# Patient Record
Sex: Female | Born: 1985 | Hispanic: No | Marital: Married | Smoking: Never smoker
Health system: Southern US, Community
[De-identification: ages and names within clinical notes are randomized; demographics above are authoritative.]

## PROBLEM LIST (undated history)

## (undated) DIAGNOSIS — I4510 Unspecified right bundle-branch block: Secondary | ICD-10-CM

## (undated) HISTORY — PX: RHINOPLASTY: SUR1284

---

## 2016-01-20 ENCOUNTER — Encounter (HOSPITAL_BASED_OUTPATIENT_CLINIC_OR_DEPARTMENT_OTHER): Payer: Self-pay

## 2016-01-20 ENCOUNTER — Ambulatory Visit (HOSPITAL_BASED_OUTPATIENT_CLINIC_OR_DEPARTMENT_OTHER)
Admission: RE | Admit: 2016-01-20 | Discharge: 2016-01-20 | Disposition: A | Discharge status: Home / Self Care | Payer: BLUE CROSS/BLUE SHIELD | Source: Ambulatory Visit | Attending: Emergency Medicine | Admitting: Emergency Medicine

## 2016-01-20 ENCOUNTER — Telehealth (HOSPITAL_BASED_OUTPATIENT_CLINIC_OR_DEPARTMENT_OTHER): Payer: Self-pay

## 2016-01-20 ENCOUNTER — Emergency Department (HOSPITAL_BASED_OUTPATIENT_CLINIC_OR_DEPARTMENT_OTHER)
Admission: EM | Admit: 2016-01-20 | Discharge: 2016-01-20 | Disposition: A | Discharge status: Home / Self Care | Payer: BLUE CROSS/BLUE SHIELD | Attending: Emergency Medicine | Admitting: Emergency Medicine

## 2016-01-20 ENCOUNTER — Encounter (HOSPITAL_BASED_OUTPATIENT_CLINIC_OR_DEPARTMENT_OTHER): Payer: Self-pay | Admitting: *Deleted

## 2016-01-20 DIAGNOSIS — O9989 Other specified diseases and conditions complicating pregnancy, childbirth and the puerperium: Secondary | ICD-10-CM | POA: Diagnosis not present

## 2016-01-20 DIAGNOSIS — R102 Pelvic and perineal pain: Secondary | ICD-10-CM

## 2016-01-20 DIAGNOSIS — N939 Abnormal uterine and vaginal bleeding, unspecified: Secondary | ICD-10-CM | POA: Insufficient documentation

## 2016-01-20 DIAGNOSIS — O209 Hemorrhage in early pregnancy, unspecified: Secondary | ICD-10-CM | POA: Diagnosis present

## 2016-01-20 DIAGNOSIS — Z8679 Personal history of other diseases of the circulatory system: Secondary | ICD-10-CM | POA: Insufficient documentation

## 2016-01-20 DIAGNOSIS — Z3A01 Less than 8 weeks gestation of pregnancy: Secondary | ICD-10-CM | POA: Diagnosis not present

## 2016-01-20 DIAGNOSIS — M545 Low back pain: Secondary | ICD-10-CM | POA: Diagnosis not present

## 2016-01-20 DIAGNOSIS — Z79899 Other long term (current) drug therapy: Secondary | ICD-10-CM | POA: Insufficient documentation

## 2016-01-20 DIAGNOSIS — O4690 Antepartum hemorrhage, unspecified, unspecified trimester: Secondary | ICD-10-CM

## 2016-01-20 HISTORY — DX: Unspecified right bundle-branch block: I45.10

## 2016-01-20 LAB — URINALYSIS, ROUTINE W REFLEX MICROSCOPIC
BILIRUBIN URINE: NEGATIVE
Glucose, UA: NEGATIVE mg/dL
Ketones, ur: NEGATIVE mg/dL
LEUKOCYTES UA: NEGATIVE
NITRITE: NEGATIVE
PH: 6 (ref 5.0–8.0)
Protein, ur: NEGATIVE mg/dL
SPECIFIC GRAVITY, URINE: 1.013 (ref 1.005–1.030)

## 2016-01-20 LAB — WET PREP, GENITAL
SPERM: NONE SEEN
Trich, Wet Prep: NONE SEEN
YEAST WET PREP: NONE SEEN

## 2016-01-20 LAB — URINE MICROSCOPIC-ADD ON
RBC / HPF: NONE SEEN RBC/hpf (ref 0–5)
WBC, UA: NONE SEEN WBC/hpf (ref 0–5)

## 2016-01-20 LAB — HCG, QUANTITATIVE, PREGNANCY: HCG, BETA CHAIN, QUANT, S: 127 m[IU]/mL — AB (ref <5)

## 2016-01-20 LAB — PREGNANCY, URINE: Preg Test, Ur: POSITIVE — AB

## 2016-01-20 MED ORDER — RHO D IMMUNE GLOBULIN 1500 UNIT/2ML IJ SOSY
300.0000 ug | PREFILLED_SYRINGE | Freq: Once | INTRAMUSCULAR | Status: AC
  Administered 2016-01-20: 300 ug via INTRAMUSCULAR

## 2016-01-20 NOTE — Discharge Instructions (Signed)
Go to Dr. Adaline Sill office in 2 days to get your hCG level rechecked. Today's was low at 127, consistent with miscarriage. You have been given RhoGAM here. Your blood type is A-. Take these instructions with you when you go to see Dr. Loleta Chance If you develop worsening abdominal pain, significant bleeding or feel faint go to the nearest emergency department immediately

## 2016-01-20 NOTE — ED Provider Notes (Addendum)
CSN: [id number]     Arrival date & time 01/20/16  0751 History   First MD Initiated Contact with Patient 01/20/16 0802     Chief Complaint  Patient presents with  . Abdominal Pain     (Consider location/radiation/quality/duration/timing/severity/associated sxs/prior Treatment) Patient is a 30 y.o. female presenting with abdominal pain.  Abdominal Pain Associated symptoms: vaginal bleeding    Patient developed vaginal bleeding this morning when she urinated. She is currently pregnant. Last normal menstrual period 12/10/2015. She's had crampy lower abdominal pain, mild for the past 4 weeks she developed bilateral low back pain this morning. Pain is mild she denies any lightheadedness. No other associated symptoms. No treatment prior to coming here. Past Medical History  Diagnosis Date  . Right bundle branch block    Past Surgical History  Procedure Laterality Date  . Rhinoplasty     No family history on file. Social History  Substance Use Topics  . Smoking status: Never Smoker   . Smokeless tobacco: None  . Alcohol Use: Yes   no illicit drug use no alcohol OB History    No data available     Review of Systems  Gastrointestinal: Positive for abdominal pain.  Genitourinary: Positive for vaginal bleeding.  Musculoskeletal: Positive for back pain.  All other systems reviewed and are negative.     Allergies  Review of patient's allergies indicates no known allergies.  Home Medications   Prior to Admission medications   Medication Sig Start Date End Date Taking? Authorizing Provider  Loratadine (CLARITIN PO) Take by mouth.   Yes Historical Provider, MD  Prenatal Vit-Fe Fumarate-FA (PRENATAL MULTIVITAMIN) TABS tablet Take 1 tablet by mouth daily at 12 noon.   Yes Historical Provider, MD   BP 117/79 mmHg  Pulse 72  Temp(Src) 98.2 F (36.8 C) (Oral)  Resp 18  Ht  (1.727 m)  Wt 148 lb (67.132 kg)  BMI 22.51 kg/m2  SpO2 97% Physical Exam  Constitutional: She  appears well-developed and well-nourished.  HENT:  Head: Normocephalic and atraumatic.  Eyes: Conjunctivae are normal. Pupils are equal, round, and reactive to light.  Neck: Neck supple. No tracheal deviation present. No thyromegaly present.  Cardiovascular: Normal rate and regular rhythm.   No murmur heard. Pulmonary/Chest: Effort normal and breath sounds normal.  Abdominal: Soft. Bowel sounds are normal. She exhibits no distension. There is no tenderness.  Genitourinary:  No external lesion dark blood in vaginal vault. Cervical os closed. No cervical motion tenderness no adnexal masses or tenderness  Musculoskeletal: Normal range of motion. She exhibits no edema or tenderness.  No point tenderness no flank tenderness  Neurological: She is alert. Coordination normal.  Skin: Skin is warm and dry. No rash noted.  Psychiatric: She has a normal mood and affect.  Nursing note and vitals reviewed.   ED Course  Procedures (including critical care time) Labs Review Labs Reviewed  WET PREP, GENITAL  URINALYSIS, ROUTINE W REFLEX MICROSCOPIC (NOT AT [facility])  PREGNANCY, URINE  RPR  HIV ANTIBODY (ROUTINE TESTING)  GC/CHLAMYDIA PROBE AMP ([facility]) NOT AT [facility]    Imaging Review No results found. I have personally reviewed and evaluated these images and lab results as part of my medical decision-making.   EKG Interpretation None     8 45 AM patient comfortable Results for orders placed or performed during the hospital encounter of 01/20/16  Wet prep, genital  Result Value Ref Range   Yeast Wet Prep HPF POC NONE SEEN NONE SEEN  Trich, Wet Prep NONE SEEN NONE SEEN   Clue Cells Wet Prep HPF POC PRESENT (A) NONE SEEN   WBC, Wet Prep HPF POC MANY (A) NONE SEEN   Sperm NONE SEEN   Urinalysis, Routine w reflex microscopic (not at [facility])  Result Value Ref Range   Color, Urine YELLOW YELLOW   APPearance CLEAR CLEAR   Specific Gravity, Urine 1.013 1.005 - 1.030   pH 6.0 5.0 - 8.0    Glucose, UA NEGATIVE NEGATIVE mg/dL   Hgb urine dipstick SMALL (A) NEGATIVE   Bilirubin Urine NEGATIVE NEGATIVE   Ketones, ur NEGATIVE NEGATIVE mg/dL   Protein, ur NEGATIVE NEGATIVE mg/dL   Nitrite NEGATIVE NEGATIVE   Leukocytes, UA NEGATIVE NEGATIVE  Pregnancy, urine  Result Value Ref Range   Preg Test, Ur POSITIVE (A) NEGATIVE  Urine microscopic-add on  Result Value Ref Range   Squamous Epithelial / LPF 6-30 (A) NONE SEEN   WBC, UA NONE SEEN 0 - 5 WBC/hpf   RBC / HPF NONE SEEN 0 - 5 RBC/hpf   Bacteria, UA FEW (A) NONE SEEN   No results found.  MDM  Differential diagnosis includes threatened abortion, missed abortion, completed abortion, ectopic pregnancy less likely. Patient needs pelvic ultrasound. Ultrasound staff available 12 noon today. She'll be discharged and instructed to return at 12 noon for pelvic ultrasound which I will discuss with her and formulate treatment plan based on ultrasound and other lab work. Metronidazole contraindicated during first trimester pregnancy Diagnosis bleeding during pregnancy  Final diagnoses:  None        Doug Sou, MD 01/20/16 [phone]  Addendum patient return for pelvic ultrasound. Results discussed with patient and with Ms Vinetta Bergamo, on-call for patient's gynecologist, Dr. Loleta Chance plan patient to go to Dr. Adaline Sill office in 2 days to check repeat hCG. Ectopic instructions given Ultrasound images given to patient Results for orders placed or performed during the hospital encounter of 01/20/16  Wet prep, genital  Result Value Ref Range   Yeast Wet Prep HPF POC NONE SEEN NONE SEEN   Trich, Wet Prep NONE SEEN NONE SEEN   Clue Cells Wet Prep HPF POC PRESENT (A) NONE SEEN   WBC, Wet Prep HPF POC MANY (A) NONE SEEN   Sperm NONE SEEN   Urinalysis, Routine w reflex microscopic (not at [facility])  Result Value Ref Range   Color, Urine YELLOW YELLOW   APPearance CLEAR CLEAR   Specific Gravity, Urine 1.013 1.005 - 1.030   pH 6.0 5.0 -  8.0   Glucose, UA NEGATIVE NEGATIVE mg/dL   Hgb urine dipstick SMALL (A) NEGATIVE   Bilirubin Urine NEGATIVE NEGATIVE   Ketones, ur NEGATIVE NEGATIVE mg/dL   Protein, ur NEGATIVE NEGATIVE mg/dL   Nitrite NEGATIVE NEGATIVE   Leukocytes, UA NEGATIVE NEGATIVE  Pregnancy, urine  Result Value Ref Range   Preg Test, Ur POSITIVE (A) NEGATIVE  hCG, quantitative, pregnancy  Result Value Ref Range   hCG, Beta Chain, Quant, S 127 (H) <5 mIU/mL  Urine microscopic-add on  Result Value Ref Range   Squamous Epithelial / LPF 6-30 (A) NONE SEEN   WBC, UA NONE SEEN 0 - 5 WBC/hpf   RBC / HPF NONE SEEN 0 - 5 RBC/hpf   Bacteria, UA FEW (A) NONE SEEN  ABO/Rh  Result Value Ref Range   ABO/RH(D) A NEG    US Ob Comp Less 14 Wks  01/20/2016  CLINICAL DATA:  Pelvic cramping and pain for 2 weeks. Vaginal bleeding since this  morning. Quantitative beta HCG 127. EXAM: OBSTETRIC <14 WK [place] AND TRANSVAGINAL OB US TECHNIQUE: Both transabdominal and transvaginal ultrasound examinations were performed for complete evaluation of the gestation as well as the maternal uterus, adnexal regions, and pelvic cul-de-sac. Transvaginal technique was performed to assess early pregnancy. COMPARISON:  None. FINDINGS: Intrauterine gestational sac: Not visualized Yolk sac:  Not visualized Embryo:  Not visualized Cardiac Activity: Not visualized Subchorionic hemorrhage:  None visualized. Maternal uterus/adnexae: Uterus is normal in size. Normal endometrial thickness. Mild heterogeneous echogenic debris within the lower uterine segment may represent blood products. Patient is actively bleeding during the exam. Ovaries appear normal. Trace pelvic free fluid. No definite adnexal abnormality. IMPRESSION: No visualized intrauterine gestational sac or pregnancy. Thickened elongated echogenic area within the lower uterine segment may represent ongoing bleeding. Normal ovaries.  No adnexal abnormality Trace pelvic free fluid. Electronically Signed    By: Judie Petit.  Shick M.D.   On: 01/20/2016 12:37   US Ob Transvaginal  01/20/2016  CLINICAL DATA:  Pelvic cramping and pain for 2 weeks. Vaginal bleeding since this morning. Quantitative beta HCG 127. EXAM: OBSTETRIC <14 WK [place] AND TRANSVAGINAL OB US TECHNIQUE: Both transabdominal and transvaginal ultrasound examinations were performed for complete evaluation of the gestation as well as the maternal uterus, adnexal regions, and pelvic cul-de-sac. Transvaginal technique was performed to assess early pregnancy. COMPARISON:  None. FINDINGS: Intrauterine gestational sac: Not visualized Yolk sac:  Not visualized Embryo:  Not visualized Cardiac Activity: Not visualized Subchorionic hemorrhage:  None visualized. Maternal uterus/adnexae: Uterus is normal in size. Normal endometrial thickness. Mild heterogeneous echogenic debris within the lower uterine segment may represent blood products. Patient is actively bleeding during the exam. Ovaries appear normal. Trace pelvic free fluid. No definite adnexal abnormality. IMPRESSION: No visualized intrauterine gestational sac or pregnancy. Thickened elongated echogenic area within the lower uterine segment may represent ongoing bleeding. Normal ovaries.  No adnexal abnormality Trace pelvic free fluid. Electronically Signed   By: Judie Petit.  Shick M.D.   On: 01/20/2016 12:37   Rhogam given to pt   Doug Sou, MD 01/20/16 1334

## 2016-01-20 NOTE — ED Notes (Signed)
Pelvic cart set up outside of room. 

## 2016-01-20 NOTE — ED Notes (Signed)
Patient returned at noon for outpatient U/S. Bloodbank notified ED that patient Rh negative. Rhogam ordered by Dr. Ethelda Chick and given following MD notification of U/S results

## 2016-01-20 NOTE — ED Notes (Addendum)
Patient c/o abd  & back pain. She states she has the constant urge to urinate & noticed blood clots in her urine this morning. LMP was December 19. Concerned because she is [redacted] weeks pregnant.

## 2016-01-21 LAB — GC/CHLAMYDIA PROBE AMP (~~LOC~~) NOT AT ARMC
CHLAMYDIA, DNA PROBE: NEGATIVE
NEISSERIA GONORRHEA: NEGATIVE

## 2016-01-21 LAB — ABO/RH
ABO/RH(D): A NEG
ANTIBODY SCREEN: NEGATIVE

## 2016-01-21 LAB — RPR: RPR: NONREACTIVE

## 2016-01-21 LAB — HIV ANTIBODY (ROUTINE TESTING W REFLEX): HIV SCREEN 4TH GENERATION: NONREACTIVE

## 2017-03-15 IMAGING — US US OB TRANSVAGINAL
1 series · 13 of 28 positions shown · non-contrast
Comparison: None.

CLINICAL DATA: Pelvic cramping and pain for 2 weeks. Vaginal
bleeding since this morning. Quantitative beta HCG 127.

EXAM:
OBSTETRIC <14 WK US AND TRANSVAGINAL OB US
TECHNIQUE: Both transabdominal and transvaginal ultrasound examinations were
performed for complete evaluation of the gestation as well as the
maternal uterus, adnexal regions, and pelvic cul-de-sac.
Transvaginal technique was performed to assess early pregnancy.

[Series 1: us ob transvaginal · 0.24mm/px · 13 of 91 slices shown]
[im 4/91]
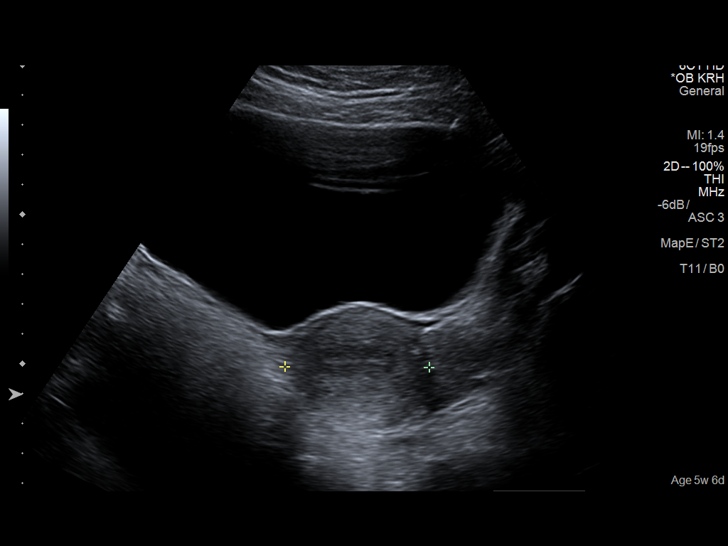
[im 11/91]
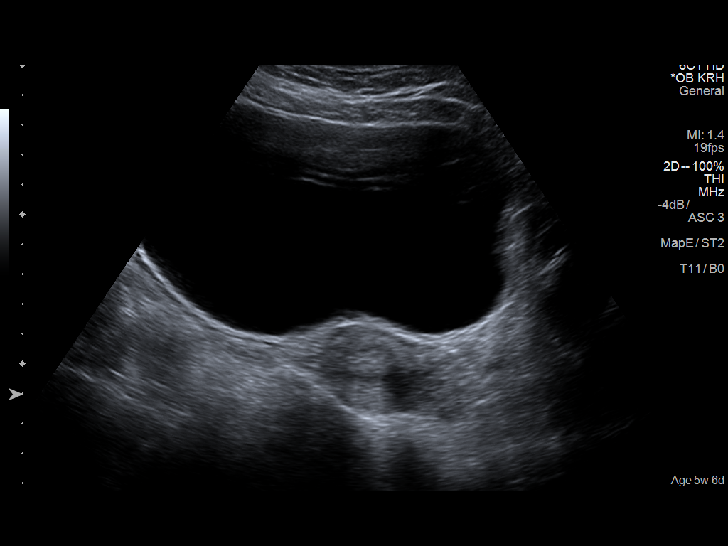
[im 17/91]
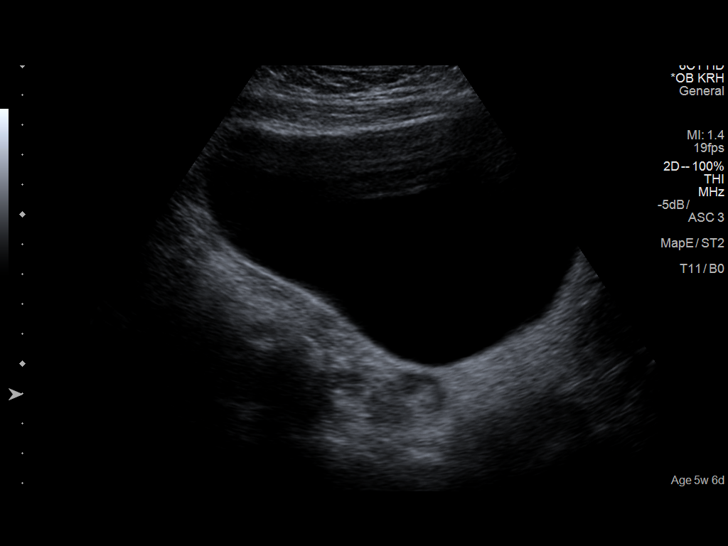
[im 24/91]
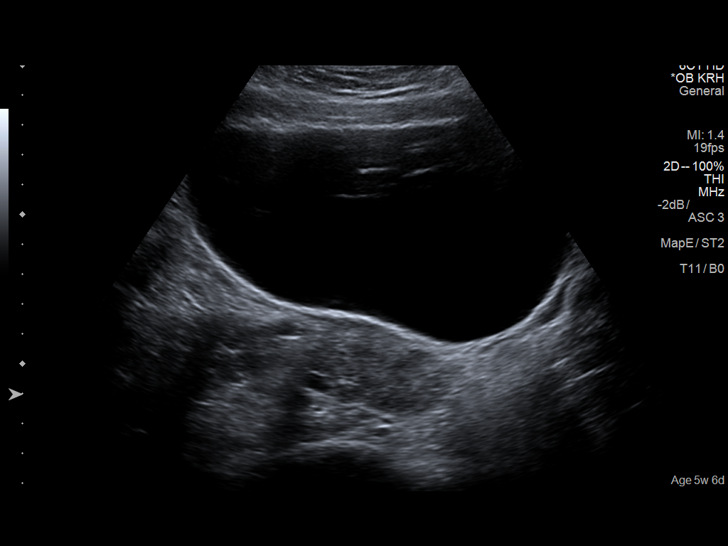
[im 31/91]
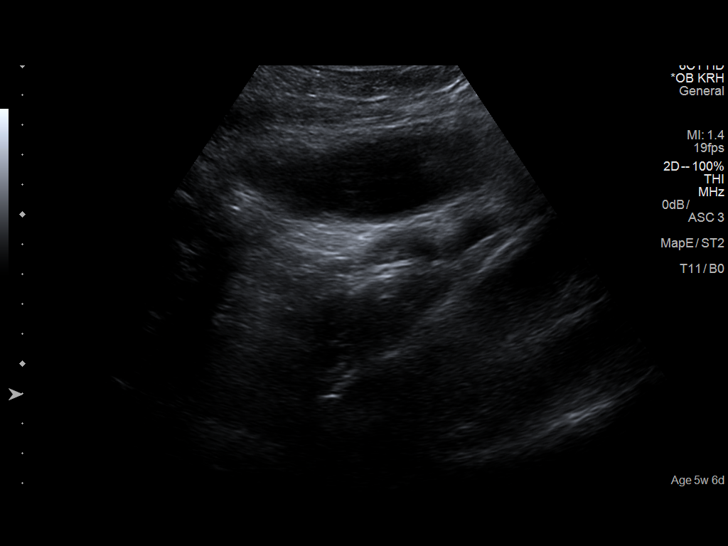
[im 37/91]
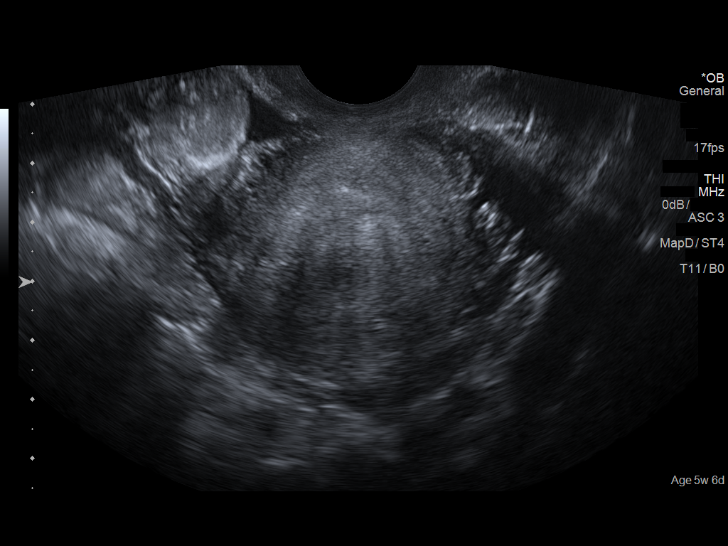
[im 47/91]
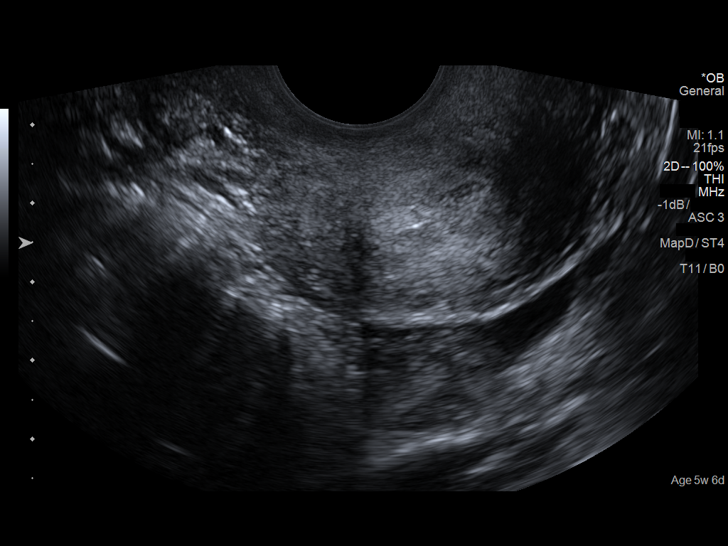
[im 54/91]
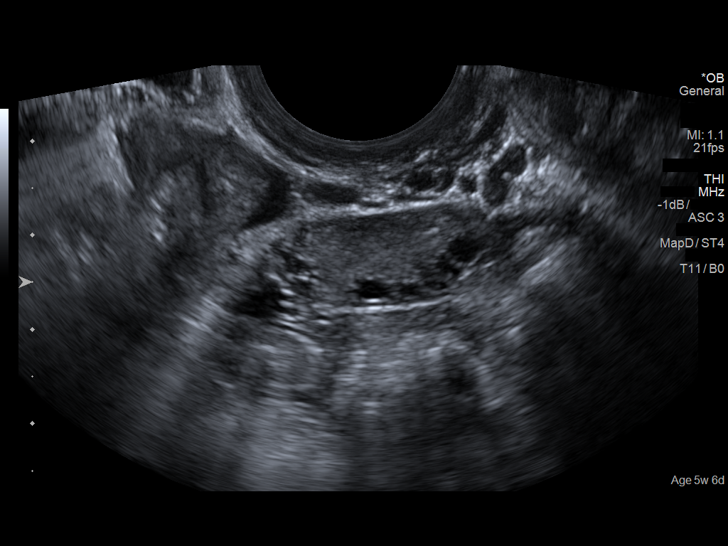
[im 61/91]
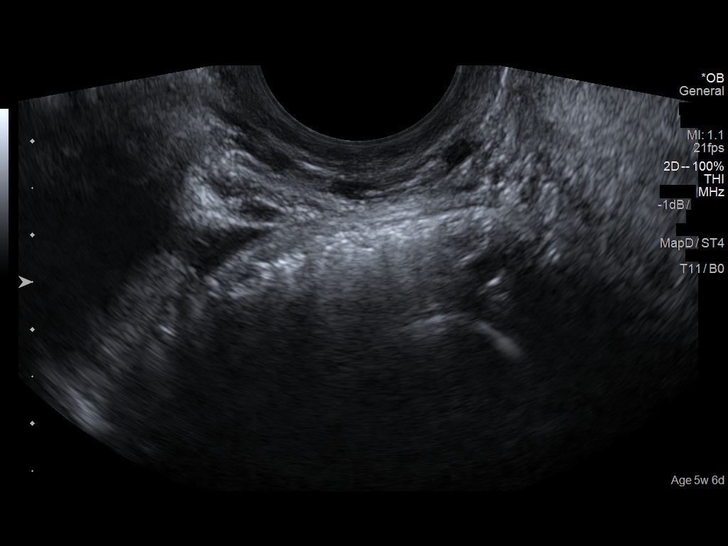
[im 67/91]
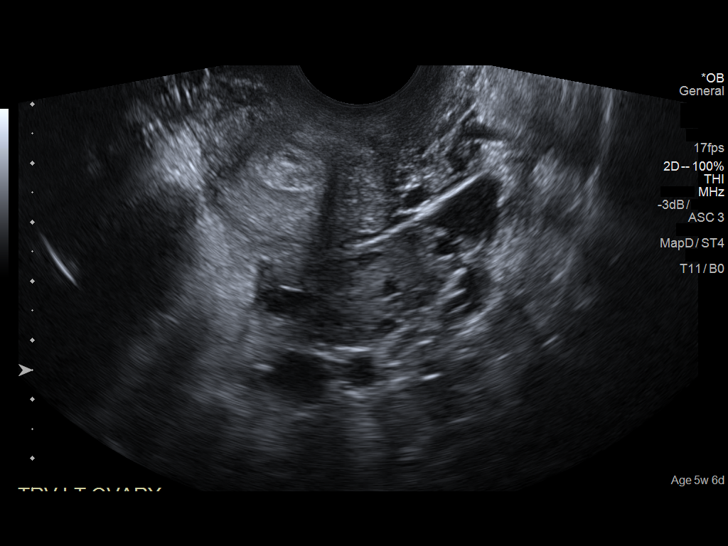
[im 74/91]
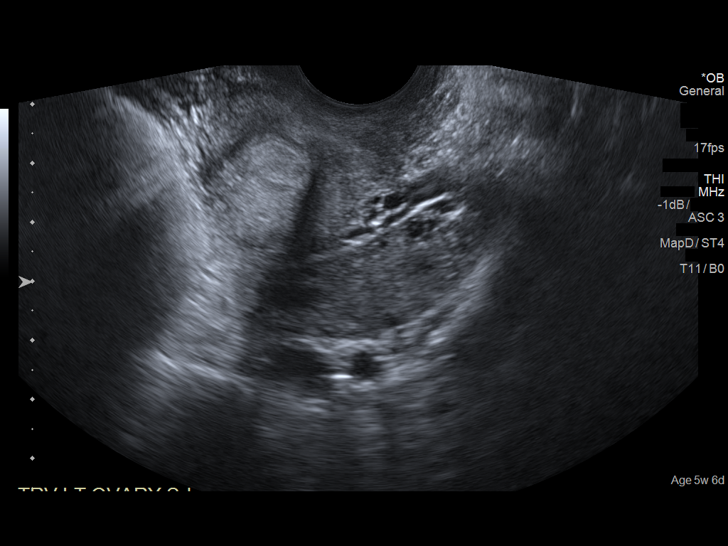
[im 81/91]
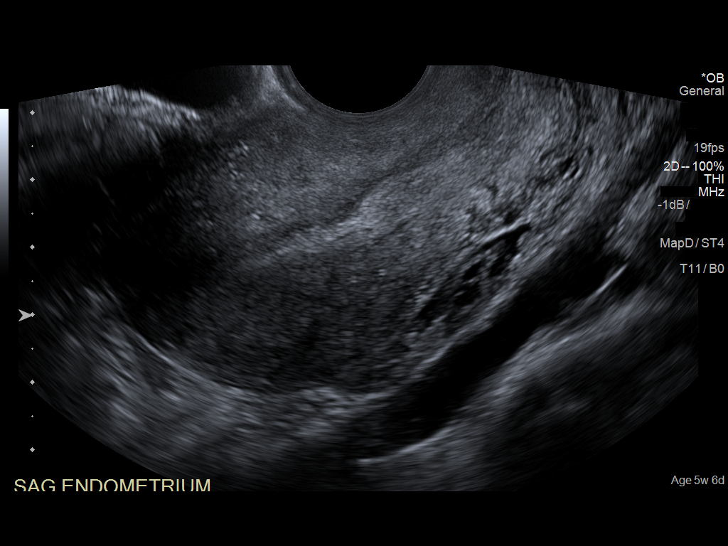
[im 87/91]
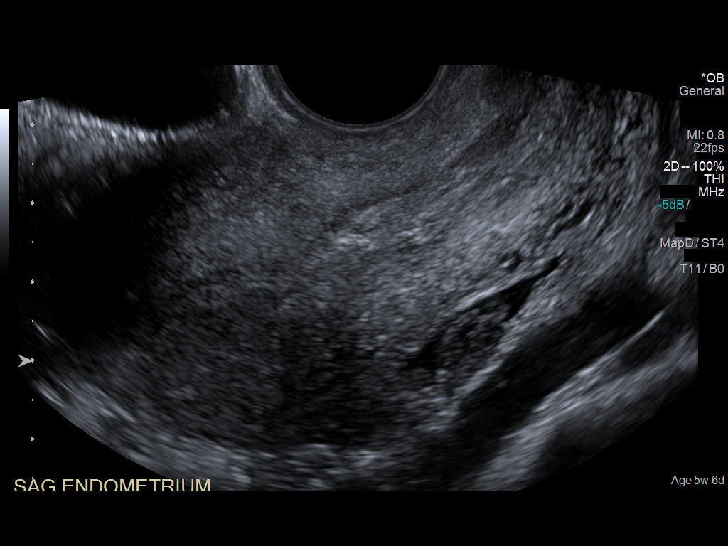

[13 of 28 positions shown; findings below may reference images not displayed]

FINDINGS: Intrauterine gestational sac: Not visualized

Yolk sac:  Not visualized

Embryo:  Not visualized

Cardiac Activity: Not visualized

Subchorionic hemorrhage:  None visualized.

Maternal uterus/adnexae: Uterus is normal in size. Normal
endometrial thickness. Mild heterogeneous echogenic debris within
the lower uterine segment may represent blood products. Patient is
actively bleeding during the exam. Ovaries appear normal. Trace
pelvic free fluid. No definite adnexal abnormality.
IMPRESSION: No visualized intrauterine gestational sac or pregnancy.

Thickened elongated echogenic area within the lower uterine segment
may represent ongoing bleeding.

Normal ovaries.  No adnexal abnormality

Trace pelvic free fluid.
